# Patient Record
Sex: Female | Born: 1970 | Race: Black or African American | Hispanic: No | Marital: Married | Smoking: Never smoker
Health system: Southern US, Community
[De-identification: ages and names within clinical notes are randomized; demographics above are authoritative.]

## PROBLEM LIST (undated history)

## (undated) DIAGNOSIS — I10 Essential (primary) hypertension: Secondary | ICD-10-CM

## (undated) HISTORY — DX: Essential (primary) hypertension: I10

---

## 2000-01-19 ENCOUNTER — Encounter: Payer: Self-pay | Admitting: Obstetrics and Gynecology

## 2000-01-19 ENCOUNTER — Ambulatory Visit (HOSPITAL_COMMUNITY): Admission: RE | Admit: 2000-01-19 | Discharge: 2000-01-19 | Payer: Self-pay | Admitting: Obstetrics and Gynecology

## 2000-05-04 ENCOUNTER — Inpatient Hospital Stay (HOSPITAL_COMMUNITY): Admission: RE | Admit: 2000-05-04 | Discharge: 2000-05-06 | Payer: Self-pay | Admitting: Obstetrics and Gynecology

## 2000-11-24 ENCOUNTER — Inpatient Hospital Stay (HOSPITAL_COMMUNITY): Admission: AD | Admit: 2000-11-24 | Discharge: 2000-11-24 | Payer: Self-pay | Admitting: Obstetrics and Gynecology

## 2001-05-16 ENCOUNTER — Inpatient Hospital Stay (HOSPITAL_COMMUNITY): Admission: AD | Admit: 2001-05-16 | Discharge: 2001-05-20 | Payer: Self-pay | Admitting: Obstetrics and Gynecology

## 2001-05-16 ENCOUNTER — Encounter: Payer: Self-pay | Admitting: Obstetrics & Gynecology

## 2001-05-21 ENCOUNTER — Encounter: Admission: RE | Admit: 2001-05-21 | Discharge: 2001-06-20 | Payer: Self-pay | Admitting: Obstetrics and Gynecology

## 2001-05-23 ENCOUNTER — Inpatient Hospital Stay (HOSPITAL_COMMUNITY): Admission: AD | Admit: 2001-05-23 | Discharge: 2001-05-23 | Payer: Self-pay | Admitting: Obstetrics and Gynecology

## 2001-06-21 ENCOUNTER — Encounter: Admission: RE | Admit: 2001-06-21 | Discharge: 2001-07-21 | Payer: Self-pay | Admitting: Obstetrics and Gynecology

## 2001-07-22 ENCOUNTER — Encounter: Admission: RE | Admit: 2001-07-22 | Discharge: 2001-08-21 | Payer: Self-pay | Admitting: Obstetrics and Gynecology

## 2001-09-21 ENCOUNTER — Encounter: Admission: RE | Admit: 2001-09-21 | Discharge: 2001-10-21 | Payer: Self-pay | Admitting: Obstetrics and Gynecology

## 2005-12-07 ENCOUNTER — Encounter: Admission: RE | Admit: 2005-12-07 | Discharge: 2005-12-07 | Payer: Self-pay | Admitting: Obstetrics and Gynecology

## 2006-06-02 ENCOUNTER — Encounter: Admission: RE | Admit: 2006-06-02 | Discharge: 2006-06-02 | Payer: Self-pay | Admitting: Obstetrics and Gynecology

## 2006-06-07 ENCOUNTER — Encounter: Admission: RE | Admit: 2006-06-07 | Discharge: 2006-06-07 | Payer: Self-pay | Admitting: Interventional Radiology

## 2006-07-29 ENCOUNTER — Ambulatory Visit (HOSPITAL_COMMUNITY): Admission: RE | Admit: 2006-07-29 | Discharge: 2006-07-29 | Payer: Self-pay | Admitting: Interventional Radiology

## 2006-08-01 ENCOUNTER — Ambulatory Visit (HOSPITAL_COMMUNITY): Admission: RE | Admit: 2006-08-01 | Discharge: 2006-08-02 | Payer: Self-pay | Admitting: Interventional Radiology

## 2007-03-11 ENCOUNTER — Encounter: Admission: RE | Admit: 2007-03-11 | Discharge: 2007-03-11 | Payer: Self-pay | Admitting: Interventional Radiology

## 2007-03-15 ENCOUNTER — Encounter: Admission: RE | Admit: 2007-03-15 | Discharge: 2007-03-15 | Payer: Self-pay | Admitting: Interventional Radiology

## 2007-07-19 ENCOUNTER — Encounter: Payer: Self-pay | Admitting: Internal Medicine

## 2007-07-19 ENCOUNTER — Observation Stay (HOSPITAL_COMMUNITY): Admission: EM | Admit: 2007-07-19 | Discharge: 2007-07-21 | Payer: Self-pay | Admitting: Internal Medicine

## 2009-11-06 ENCOUNTER — Ambulatory Visit (HOSPITAL_BASED_OUTPATIENT_CLINIC_OR_DEPARTMENT_OTHER): Admission: RE | Admit: 2009-11-06 | Discharge: 2009-11-06 | Payer: Self-pay | Admitting: Obstetrics and Gynecology

## 2009-11-06 ENCOUNTER — Ambulatory Visit: Payer: Self-pay | Admitting: Diagnostic Radiology

## 2010-11-15 ENCOUNTER — Encounter: Payer: Self-pay | Admitting: Interventional Radiology

## 2010-11-17 ENCOUNTER — Other Ambulatory Visit (HOSPITAL_BASED_OUTPATIENT_CLINIC_OR_DEPARTMENT_OTHER): Payer: Self-pay | Admitting: Obstetrics and Gynecology

## 2010-11-17 DIAGNOSIS — Z139 Encounter for screening, unspecified: Secondary | ICD-10-CM

## 2010-11-25 ENCOUNTER — Encounter: Payer: Self-pay | Admitting: Obstetrics and Gynecology

## 2010-11-26 ENCOUNTER — Ambulatory Visit (HOSPITAL_BASED_OUTPATIENT_CLINIC_OR_DEPARTMENT_OTHER): Payer: Self-pay

## 2010-12-04 ENCOUNTER — Ambulatory Visit (HOSPITAL_BASED_OUTPATIENT_CLINIC_OR_DEPARTMENT_OTHER)
Admission: RE | Admit: 2010-12-04 | Discharge: 2010-12-04 | Disposition: A | Discharge status: Home / Self Care | Payer: BC Managed Care – PPO | Source: Ambulatory Visit | Attending: Obstetrics and Gynecology | Admitting: Obstetrics and Gynecology

## 2010-12-04 DIAGNOSIS — Z139 Encounter for screening, unspecified: Secondary | ICD-10-CM

## 2010-12-04 DIAGNOSIS — Z1231 Encounter for screening mammogram for malignant neoplasm of breast: Secondary | ICD-10-CM | POA: Insufficient documentation

## 2011-03-09 NOTE — H&P (Signed)
NAME:  Adrienne Long, Adrienne Long NO.:  [id number]   MEDICAL RECORD NO.:  [id number]          PATIENT TYPE:  EMS   LOCATION:  ED                           FACILITY:  [facility]   PHYSICIAN:  Herbie Saxon, MDDATE OF BIRTH:  02/23/1971   DATE OF ADMISSION:  07/19/2007  DATE OF DISCHARGE:                              HISTORY & PHYSICAL   PRIMARY CARE PHYSICIAN:  Dr. Lady Saucier of [facility].   PRESENTING COMPLAINT:  Lip and throat swelling, one day.   HISTORY OF PRESENTING COMPLAINT:  This is a 40 year old, African-  American female, who woke up at 2 a.m. earlier this morning with a sense  of severe lip and throat swelling associated with a mild difficulty with  breathing, no cough, no wheezes, no prior episode of body swelling.  There is no fever, no joint swelling, no skin rash.  She has a slight  itching on the right breast.  The patient was started on Lisinopril a  week ago by the primary care physician.  Presently she denies any chest  pain or palpitation.  There was no syncopal or seizure episode.  She is  not in respiratory distress presently.  No GI or genitourinary symptoms.  Twelve systems reviewed and pertinent as stated above.   PAST MEDICAL HISTORY:  Hypertension.   ALLERGIES:  No known drug allergies prior to this episode.   PAST SURGICAL HISTORY:  Myomectomy in 2001.   FAMILY HISTORY:  Father had heart disease.   SOCIAL HISTORY:  The patient is married and has one child. There is no  history of alcohol, tobacco, or illicit drug abuse.   ALLERGIES:  No known drug allergies  /MEDICATIONS/>  1. Lisinopril 10 mg daily.  2. HCTZ 12.5 mg daily.  3. Phentermine 37.5 mg daily.   PHYSICAL EXAMINATION:  GENERAL:  She is a young lady not in acute  respiratory distress.  VITAL SIGNS:  Temperature is 98, pulse is 98, respiratory rate is 16,  blood pressure 133/94.  HEENT:  Pupils equal and reactive to light and accommodation.  She is  nonverbal presently as she is unable to open her mouth.  The right upper  and lower lip are grossly swollen and not tender.  LUNGS:  Supple with no lymphadenopathy, no thyromegaly, no carotid  bruit.  CHEST:  The lungs are clinically clear, no rhonchi or rales.  CARDIAC:  Heart sounds 1 and 2, regular rate and rhythm.  ABDOMEN:  Soft and nontender, no organomegaly, bowel sounds normoactive,  inguinal orifices are intact .  EXTREMITIES:  peripheral pulses present, no pedal edema, power is 5  overly.   Labs are pending.   ASSESSMENT:  Severe angioedema most likely secondary to ACE-I and mild  hypertension.  pPLAN  The patient will be admitted to telemetry, and the airway will be  monitored and protected, O2 at two liters p.r.n. R  We will start her on  intravenous Solu-Medrol, intravenous Benadryl, and intravenous Pepcid.  Note, the patient has had mild improvement after being given intravenous  Solu-Medrol in the emergency room.  We will continue the  intravenous  Solu-Medrol 80 mg q.8h, intravenous Benadryl 25 mg q.8h, and intravenous  Pepcid 20 mg q.12 hourly.  Sequential compression boots for deep venous  thrombosis prophylaxis.  Will check an ESR, complement level.  The  patient has been advised to  avoid ACE-I and ARB antihypertensives in  the future.  The family care physician will be notified of this  development.  She is a Full Code.  Her treatment plan was  discussed  with her husband and her mother, and she can be discharged in 24 to 48  hours if continued clinical improvement.      Herbie Saxon, MD  Electronically Signed     MIO/MEDQ  D:  07/19/2007  T:  07/19/2007  Job:  [id number]   cc:   Lady Saucier, M.D.  [facility]

## 2011-03-12 NOTE — Discharge Summary (Signed)
[facility]  Patient:    Adrienne Long, Adrienne Long Visit Number: [id number] MRN: [id number]          Service Type: MED Location: [facility] Attending Physician:  Lenoard Aden Dictated by:   Sheria Lang Cherly Hensen, M.D. Adm. Date:  05/23/2001 Disc. Date: 05/23/2001                             Discharge Summary  ADMISSION DIAGNOSES:          1. Postdates.                               2. Previous myomectomy.  DISCHARGE DIAGNOSES:          1. Term gestation, delivered.                               2. Arrest of dilatation.                               3. Iron deficiency anemia.                               4. Fetal macrosomia.  PROCEDURE:                    Primary cesarean section, Kerr hysterotomy.  HISTORY OF PRESENT ILLNESS:   Please see the dictated history and physical. Essentially, this is a 40 year old, gravida 1, para 0, at 40-3/7 weeks admitted for induction of labor with a favorable cervix at 2 cm, 70% to 80% effaced, -3, vertex presentation. Estimated fetal weight by ultrasound had been 8 pounds 8 ounces on April 25, 2001. Group B strep culture was negative.  HOSPITAL COURSE:              The patient was admitted. She was started on Pitocin. The patient had artificial rupture of membranes, clear fluid. At that time, her cervix was a loose 4 cm, 70%, -3 station. Intrauterine pressure catheter was placed. Pitocin was continued. The patient progressed and arrested at 5 cm dilatation with a -3 vertex presentation despite adequate intrauterine pressure. For this reason, the patient was taken to the operating room where she underwent a prior cesarean section with a resultant delivery of a live female infant from the left occiput transverse position weighing 9 pounds 7 ounces, Apgars of 8 and 9. True knot was noted in the cord. The right tube and ovary had some adhesions to the right posterior uterus; otherwise normal. The left tube and ovary were  normal. During the procedure the patient had removal of a keloid scar. Estimated blood loss was 800 cc.  The patient had an unremarkable postoperative course. She was on a regular diet, passing flatus by postoperative day #2. She remained afebrile throughout her hospital course.  Her CBC on postoperative day #1 revealed a hemoglobin of 9.5, hematocrit of 28.6, platelet count of 243,000.  On postoperative day #3, with the patient having had a bowel movement, feeling well, incision without any evidence of erythema, induration, or exudate, was deemed to be discharged home.  DISPOSITION:                  Home.  CONDITION ON  DISCHARGE:       Stable.  DISCHARGE MEDICATIONS:        1. Tylox, #20, one to tablets every three to                                  four hours p.r.n. pain.                               2. Motrin 600 mg p.o. q.6h. p.r.n. pain.                               3. Ferrous sulfate 325 mg one p.o. b.i.d.  DISCHARGE FOLLOWUP:           The patient is to follow up in the office at [facility] at four to six weeks.  DISCHARGE INSTRUCTIONS:       The patient is to call for temperature greater than or equal to 100.4, nothing per vagina for four to six weeks, no heavy lifting or driving for two weeks, call if soaking a regular pad every hour or more frequently, call if increased incisional pain, incisional redness or drainage from the incision site, severe abdominal pain, nausea, or vomiting. Dictated by:   Sheria Lang. Cherly Hensen, M.D. Attending Physician:  Lenoard Aden DD:  06/18/01 TD:  06/19/01 Job: [id number]

## 2011-03-12 NOTE — H&P (Signed)
[facility]  Patient:    Adrienne Long, Adrienne Long                    MRN: [id number] Adm. Date:  05/16/01 Attending:  Nena Jordan A. Cherly Hensen, M.D.                         History and Physical  CHIEF COMPLAINT:              Post dates induction of labor.  HISTORY OF PRESENT ILLNESS:   This is a 40 year old gravida 1, para 18 female with LMP of August 05, 2000, [facility] of May 13, 2001 who is now at 81 3/[redacted] weeks gestation admitted for induction of labor.  Her prenatal course has been notable for excessive weight gain.  Last vaginal examination on May 12, 2001 the patient was 2 cm, 70-80% effaced, -3 vertex presentation.  Ultrasound on April 25, 2001 gave an estimated fetal weight of 8 pounds 8 ounces which was at the 96th percentile for 37 weeks and 4 days.  Normal amniotic fluid index. Patient has been evaluated for preeclampsia with normal [facility] laboratories on May 02, 2001.  Uric acid at that time was 5.4, platelet count 276,000, SGOT 20, creatinine 0.5, hematocrit 31.6.  Patient has had irregular contractions, intact membranes.  Group B strep culture is negative.  Prenatal care is at [facility], primary obstetrician Maxie Better, M.D.  PRENATAL LABORATORIES:        Blood type O+.  Hemoglobin electrophoresis negative.  RPR is nonreactive.  Rubella is immune.  Hepatitis B surface antigen is negative.  GC and chlamydia cultures are negative.  Pap is within normal limits.  One hour GTT was abnormal.  Three hour GTT was normal.  Group B strep culture is negative.  Ultrasound on December 08, 2000 was 17.6 weeks.  AFP 3 test was increased risk for Down.  Amniocentesis performed with normal karyotypes 46XY.  Normal anatomic fetal survey on December 26, 2000.  ALLERGIES:                    No known drug allergies.  MEDICATIONS:                  Prenatal vitamins.  PAST MEDICAL HISTORY:         Negative.  PAST SURGICAL HISTORY:        Myomectomy July 2001.   Cavity was not entered.  FAMILY HISTORY:               Diabetes in paternal grandmother, mother with thyroid dysfunction.  SOCIAL HISTORY:               Married.  Customer service representative. Nonsmoker.  REVIEW OF SYSTEMS:            Negative.  See history of present illness.  PHYSICAL EXAMINATION  GENERAL:                      Gravid black female in no acute distress.  VITAL SIGNS:                  Blood pressure 118/66, weight 244 pounds, fetal heart rate 148.  SKIN:                         No lesions.  HEENT:  Anicteric sclerae.  Pink conjunctivae. Oropharynx is negative.  HEART:                        Regular rate and rhythm without murmur.  LUNGS:                        Clear to auscultation.  BREASTS:                      Soft, nontender.  No palpable mass.  ABDOMEN:                      Gravid.  Fundal height of 40 cm.  PELVIC:                       See HPI.  EXTREMITIES:                  Trace edema.  IMPRESSION:                   Intrauterine gestation 14 and 3/7 weeks with favorable weeks.  PLAN:                         Admission.  Pitocin induction.  Routine laboratory studies.  Epidural p.r.n. DD:  05/16/01 TD:  05/16/01 Job: [id number]

## 2011-03-12 NOTE — Op Note (Signed)
[facility]  Patient:    TIERRE, GERARD                    MRN: [id number] Proc. Date: 05/17/01 Attending:  Nena Jordan A. Cherly Hensen, M.D.                           Operative Report  PREOPERATIVE DIAGNOSIS:       Arrestive dilatation.  POSTOPERATIVE DIAGNOSIS:      Arrestive dilatation and macrosomia.  OPERATION:                    Primary cesarean section, Kerr hysterotomy,                               revision of keloid scar.  SURGEON:                      Sheronette A. Cherly Hensen, M.D.  ANESTHESIA:                   Epidural.  INDICATIONS:                  This is a 40 year old gravida 1, para 0 female at 40-3/7 weeks admitted for induction of labor with a favorable cervix.  The patients history is notable for a previous myomectomy,.  The patient was induced with Pitocin.  She had artificial rupture of membranes and progressed to 5 cm when she arrested for several hours despite adequate _______ unit. For this reason, the patient was transferred to the operating room for a primary cesarean section.  Risks and benefits of the procedure have been explained to the patient and the family.  DESCRIPTION OF PROCEDURE:     Under adequate epidural anesthesia, the patient was placed in the supine position with a left lateral tilt.  An indwelling Foley catheter had already been in place.  The patient was therefore sterilely prepped and draped in the usual fashion.  About 7 cc of 0.25% Marcaine was injected along the previous Pfannenstiel incision.  A Pfannenstiel skin incision was made just above the previous keloid scar and carried down to the rectus fascia.  The rectus fascia was incised in the midline and extended bilaterally using cautery.  The rectus fascia was then bluntly and with cautery and scalpel dissected off the rectus muscle in a superior and inferior fashion.  There was a lot scarring encountered at this point.  The rectus muscle was  eventually split in the midline.  The parietal peritoneum was then opened and extended superiorly and inferiorly.  The vesicouterine peritoneum was then opened and extended bilaterally.  The bladder was then bluntly dissected off the lower uterine segment and displaced from the operative field using a bladder retractor.  A low transverse uterine incision was then made and extended bilaterally using bandaged scissors.  Initial inspection revealed a left occiput transverse presentation.  Vacuum was applied with pop-off x 4, with subsequent delivery of a live female infant with Apgars of 8 and 9.  Weight of 9 pounds and 7 ounces with a true knot in the cord.  The placenta was manually removed and was located anteriorly and posteriorly.  The uterine cavity was cleaned of debris.  The uterine incision was closed in two layers, the first layer with a 0 Monocryl running locked stitch, the second layer was imbricating  using 0 Monocryl.  The tubes and ovaries were inspected.  The left tube and ovary was free and normal.  The right tube and ovary was normal, but partially adherent to the right posterior uterine wall laterally.  The uterine incision was inspected and small bleeders cauterized.  The abdomen was irrigated and suctioned of debris.  The parietal peritoneum was inspected and small bleeders cauterized.  The rectus muscle was inspected and small bleeders cauterized.  The under surface of the rectus fascia was inspected and the button hole area was sutured with figure-of-eight suture.  With good hemostasis subsequently noted, the rectus fascia was closed with 0 Vicryl x 2.  The keloid scar was then removed and bleeders cauterized.  The subcutaneous area was irrigated.  Small bleeders again cauterized.  The skin was approximated using Ethicon staples.  Specimens were the placenta and the keloid scar sent to pathology.  Estimated blood loss was 800 cc. Intraoperative fluids of 2 L.  Urine output  was 150 cc of clear yellow urine. Sponge and instrument counts x 2 was correct.  Complications none.  The patient tolerated the procedure well and was transferred to the recovery room in stable condition. DD:  05/17/01 TD:  05/17/01 Job: [id number]

## 2011-03-12 NOTE — Op Note (Signed)
[facility]  Patient:    Adrienne Long, Adrienne Long                  MRN: [id number] Proc. Date: 05/04/00 Adm. Date:  [id number] Attending:  Maxie Better                           Operative Report  PREOPERATIVE DIAGNOSIS:       Menorrhagia, uterine fibroids.  POSTOPERATIVE DIAGNOSIS:      Intramural and subserosal fibroids, menorrhagia.  OPERATION:                    Exploratory laparotomy, multiple myomectomy.  SURGEON:                      Sheronette A. Cherly Hensen, M.D.  ASSISTANT:                    Cordelia Pen A. Rosalio Macadamia, M.D.  ANESTHESIA:                   General anesthesia.  ESTIMATED BLOOD LOSS:  INDICATIONS:                  This is a 40 year old, gravida 0, female with symptomatic uterine fibroids who is now being admitted for removal of her fibroids which have been documented by ultrasound and confirmed on the pelvic MRI.  The patient desires to preserve her fertility.  The risks and benefits of her procedure have been explained.  Consent was signed.  The patient was transferred to the operating room.  DESCRIPTION OF PROCEDURE:     Under adequate general anesthesia, the patient was placed in the supine position.  The abdomen and vagina were sterilely prepped, bivalve speculum was placed in the vagina.  A #8 French catheter was introduced  into the uterine cavity to which is attached methylene blue dye.  The bivalve speculum was removed.  Indwelling Foley catheter was thoroughly placed. Pneumoboots were in place.  Antibiotic prophylaxis was given.  The patient was hen sterilely draped.  A marking pen was used to outline the low transverse incision. 10 cc of 0.25% Marcaine was then injected.  Incision was then cut with a scalpel and then carried down to the rectus fascia using Bovie cautery.  The rectus fascia is incised in the midline and extended bilaterally.  The rectus fascia is then bluntly and with cautery dissected off the  rectus muscle in superior and inferior fashion.  The patient was noted to have a large and prominent pyramidalis muscles. These were then split in the midline.  The parietoperitoneum was sharply entered and extended superiorly and inferiorly.  Exploration of the upper abdomen was notable for a normal liver edge and normal palpable kidneys.  There was a retrocecal appendix.  The pelvis was notable for a large uterus about 16 weeks ize with a very large posterior intramural/subserosal fibroid.  Both tubes were noted to be normal.  Left ovary was normal.  Right ovary was normal with evidence of  corpus luteal cyst that had ruptured, but was not bleeding.  Anterior to the uterus was a 3 cm pedunculated fibroid and some other smaller superficial fibroids and two other intramural fibroids were noted.  Using a short Simpson after multiple attempts at exteriorizing the uterus, the uterus was helped to be lifted with the forceps and exteriorized thereafter.  A corkscrew was placed fundally which also  assisted with the traction of the uterus.  Using a dilute solution of Pitressin, the serosa overlying the fibroids was injected and a posterior vertical incision was then made.  The large fibroid was enucleated from its base.  Using the apparatus that was in the uterus, methylene blue was injected and no evidence of dye seen.  Through that same incision, a left intramural fibroid was also removed. That particular site was closed with interrupted 0 Vicryl figure-of-eight sutures. The large posterior incision was then closed in three layers, the first layer with a running stitch of 0 Vicryl followed by interrupted U mattress sutures of 0 Vicryl and the subserosal surface closed with a baseball stitch of 2-0 Monocryl. The pedunculated anterior fibroid was also removed after injecting the base with Pitressin and using cautery to remove it.  The defect was closed with interrupted 0 Vicryl  suture.  Smaller superficial fibroids were also removed, they were less han 4 mm in size.  The broad ligament and the uterus was palpated and no other fibroids were palpable.  At that point, the uterus was returned to the abdomen.  The abdomen was irrigated and reinspection of the incision sites showed good hemostasis. Interceed was placed on the posterior incision.  Reinspection of the ovary on the right showed no bleeding.  Packings that had been in the upper abdomen were removed.  The fascia undersurface was inspected.  Small bleeders cauterized. Good hemostasis was then noted.  The fascia was closed with 0 Vicryl x 2.  The subcutaneous area was irrigated and small bleeders cauterized.  The skin was approximated using Ethicon staples.  The specimen was myoma x 3.  Estimated blood loss was 50 cc.  Intraoperative fluid was 1700 cc Crystalloid.  Urine output was 300 cc of clear yellow urine.  The instruments from the vagina was also removed. The tube from the vagina was removed.  Complications were none.  The patient tolerated the procedure well and was transferred to the recovery room in stable  condition. DD:  05/04/00 TD:  05/04/00 Job: [id number]

## 2011-03-12 NOTE — H&P (Signed)
[facility]  Patient:    Adrienne Long, Adrienne Long                    MRN: [id number] Adm. Date:  05/04/00 Attending:  Nena Jordan A. Cherly Hensen, M.D.                         History and Physical  CHIEF COMPLAINT:              Heavy menses, uterine fibroids.  HISTORY OF PRESENT ILLNESS:   This is a 40 year old G0 married black female, Last menstrual period April 05, 2000, with known uterine fibroids who is now being admitted for a myomectomy.  The patient had discontinued birth control pills for greater than a year and noted her cycles were every 28 days, lasting about four days with the first three days being heavy with the need to change four pads per hour by the second and third day some small sized clots and some menstrual cramping.  Evaluation of the patient revealed ultrasound on January 12, 2000 that showed a 10.2 x 8.6 x 9.2 anterior fibroid and the endometrial thickness was unable to be certain due to the fibroid.  Both ovaries were normal.  The patient underwent a pelvic MRI on January 19, 2000.  This showed an enlarged uterus measuring 13 x 9 x 10 cm, at least eight discreet fibroids involving the uterus were noted.  The largest is felt to be intramural and located on the right uterine body and measured 10 cm in its maximum diameter. The endometrial cavity was displaced to the left.  There was a subserosal left lateral lower uterine segment fibroid measuring 2 x 3 cm and both ovaries were normal.  There were no pelvic adenopathy seen.  The patient does desire to conceive in the future.  ALLERGIES:                    No known drug allergies.  MEDICATIONS:                  Vitamins.  PAST MEDICAL HISTORY:         Negative.  PAST SURGICAL HISTORY:        Negative.  FAMILY HISTORY:               Notable for grandmother with diabetes and hypertension.  No breast, genital, or colon cancer history.  SOCIAL HISTORY:               Married.  Nonsmoker.   Worked as a Occupational psychologist at [employer].  REVIEW OF SYSTEMS:            No urinary symptoms.  Pelvic pressure and the rest of the other symptoms were notable for sinus problems and no other symptoms.  PHYSICAL EXAMINATION:  GENERAL:                      She is a well-developed, well-nourished, slightly obese black female in no acute distress.  VITAL SIGNS:                  Blood pressure 120/76.  Weight 196 pounds. Height 5 feet 7.5 inches.  SKIN:                         No lesions.  HEENT:  Anicteric sclerae.  Pale pink conjunctivae. Oropharynx:  Negative.  NECK:                         Supple.  No palpable thyroid.  NODES:                        No supraclavicular or axillary nodes palpable.  LUNGS:                        Clear to auscultation.  HEART:                        Regular rate and rhythm without murmur.  BREASTS:                      Soft, nontender.  No palpable mass.  ABDOMEN:                      Palpable suprapubic mass.  Nontender.  BACK:                         No CVA tenderness.  PELVIC:                       Vulva:  No lesions.  Vagina:  Slight white discharge for which a wet prep was performed.  Cervix:  No lesions.  Uterus was about 14-16 weeks size, irregular.  Adnexa:  Not appreciable secondary to the enlarged uterus.  RECTAL:                       Not indicated.  IMPRESSION:                   Symptomatic uterine fibroid.  PLAN:                         Admission.  Myomectomy.  Antibiotics prophylaxis.  ______.  Risks and benefits of procedure have been explained at length to the patient included, but not limited to, infection, bleeding which may require blood transfusion, hysterectomy in rare circumstances for uncontrollable bleeding, possible need for cesarean section in the future, internal scar tissue which may cause secondary infertility, bowel obstruction due to adhesions, possible need for  the same surgery or hysterectomy in the future up to 30% which may be decreased by the number of fibroids removed at the initial surgery.  Postop care was reviewed and the criterias for discharge were also discussed.  All questions answered. DD:  05/03/00 TD:  05/03/00 Job: [id number]

## 2011-03-12 NOTE — Discharge Summary (Signed)
[facility] [place]  Patient:    Adrienne Long, Adrienne Long                  MRN: [id number] Adm. Date:  [id number] Disc. Date: [id number] Attending:  Maxie Better                           Discharge Summary  ADMISSION DIAGNOSES:          1. Menorrhagia.                               2. Uterine fibroids.  DISCHARGE DIAGNOSES:          1. Intramural/subserosal fibroids.                               2. Menorrhagia.                               3. Iron deficiency anemia.  PROCEDURE:                    Exploratory laparotomy, myomectomy.  HISTORY OF PRESENT ILLNESS:   This is a 40 year old gravida 0 married white female with symptomatic uterine fibroids who is now being admitted for myomectomy.  Please see the dictated history and physical for the specific details.  HOSPITAL COURSE:              The patient was admitted to [facility] on May 04, 2000.  A consent was obtained for procedure.  Her preoperative laboratory studies were reviewed and was noted to have a hematocrit of 29.2, hemoglobin of 9.0, white count 5.8.  The patient was taken to the operating room.  She underwent exploratory laparotomy and myomectomy.  The findings at the time of the surgery were that of a 16 week sized uterus with a large posterior intramural fibroid measuring about 10 cm and anterior left subserosal fibroid approximately 3 cm was also noted.  Normal tubes were noted bilaterally.  The ovaries were noted bilaterally.  The right ovary with a corpus luteal cyst.  There is a retrocecal appendix.  Normal liver edge. Normal palpable kidney.  Her endometrial cavity was not entered.  Three myomas were removed.  Her estimated blood loss was 50 cc.  The pathology confirmed leiomyomata.  Her postoperative course was uncomplicated.  The patient had a ferritin level drawn which was 14.  Hemoglobin electrophoresis was subsequently noted to be normal.  Her postop CBC on day #1 had a  hemoglobin of 8.3, hematocrit 26.0.  The patient was started on iron supplementation with the passage of flatus.  She was tolerating a regular diet.  She remained afebrile throughout her course.  On postop day #2 with the patient tolerating a regular diet and showing no evidence of infection, she was deemed well to be discharged home.  Her incision which was transverse had staples.  There was no erythema, induration, or exudate.  Discharge instructions were call for temperature greater or equal to 100.4.  Nothing per vagina for four to six weeks.  No heavy lifting.  No driving for two weeks.  Call if severe abdominal pain, nausea, vomiting, or unable to tolerate food.  Staple removal planned in the office the following Wednesday.  DISPOSITION:  Home.  CONDITION:                    Stable.  DISCHARGE MEDICATIONS:        1. Niferex 150 mg one p.o. b.i.d.                               2. Tylox #30 one to two tablets every three to                                  four hours p.r.n. pain.                               3. Colace 100 mg p.o. b.i.d.  FOLLOW-UP:                    In four to six weeks for postoperative check. Staple removal in the office next Wednesday. DD:  06/07/00 TD:  06/07/00 Job: [id number]

## 2011-08-05 LAB — CBC
HCT: 39.6
Hemoglobin: 12.8
MCV: 84.5
Platelets: 271
RBC: 4.68
RDW: 15.1 — ABNORMAL HIGH
WBC: 7.1

## 2011-08-05 LAB — DIFFERENTIAL
Basophils Absolute: 0
Eosinophils Relative: 0
Lymphocytes Relative: 7 — ABNORMAL LOW
Lymphs Abs: 0.5 — ABNORMAL LOW
Monocytes Absolute: 0 — ABNORMAL LOW
Neutro Abs: 6.6

## 2011-08-05 LAB — COMPREHENSIVE METABOLIC PANEL
ALT: 15
AST: 16
Alkaline Phosphatase: 62
CO2: 26
Chloride: 104
GFR calc non Af Amer: >60
Sodium: 138
Total Bilirubin: 0.5

## 2011-08-05 LAB — URINALYSIS, ROUTINE W REFLEX MICROSCOPIC

## 2011-08-05 LAB — PROTIME-INR
INR: 1
Prothrombin Time: 13.1

## 2011-08-05 LAB — APTT: aPTT: 29

## 2011-12-13 ENCOUNTER — Other Ambulatory Visit (HOSPITAL_BASED_OUTPATIENT_CLINIC_OR_DEPARTMENT_OTHER): Payer: Self-pay | Admitting: Obstetrics and Gynecology

## 2011-12-13 DIAGNOSIS — Z1231 Encounter for screening mammogram for malignant neoplasm of breast: Secondary | ICD-10-CM

## 2011-12-15 ENCOUNTER — Ambulatory Visit (HOSPITAL_BASED_OUTPATIENT_CLINIC_OR_DEPARTMENT_OTHER)
Admission: RE | Admit: 2011-12-15 | Discharge: 2011-12-15 | Disposition: A | Discharge status: Home / Self Care | Payer: BC Managed Care – PPO | Source: Ambulatory Visit | Attending: Obstetrics and Gynecology | Admitting: Obstetrics and Gynecology

## 2011-12-15 DIAGNOSIS — Z1231 Encounter for screening mammogram for malignant neoplasm of breast: Secondary | ICD-10-CM

## 2011-12-21 ENCOUNTER — Other Ambulatory Visit: Payer: Self-pay | Admitting: Obstetrics and Gynecology

## 2011-12-21 DIAGNOSIS — R928 Other abnormal and inconclusive findings on diagnostic imaging of breast: Secondary | ICD-10-CM

## 2011-12-22 ENCOUNTER — Ambulatory Visit
Admission: RE | Admit: 2011-12-22 | Discharge: 2011-12-22 | Disposition: A | Discharge status: Home / Self Care | Payer: BC Managed Care – PPO | Source: Ambulatory Visit | Attending: Obstetrics and Gynecology | Admitting: Obstetrics and Gynecology

## 2011-12-22 DIAGNOSIS — R928 Other abnormal and inconclusive findings on diagnostic imaging of breast: Secondary | ICD-10-CM

## 2012-05-22 ENCOUNTER — Other Ambulatory Visit: Payer: Self-pay | Admitting: Obstetrics and Gynecology

## 2012-05-22 DIAGNOSIS — N649 Disorder of breast, unspecified: Secondary | ICD-10-CM

## 2012-06-02 ENCOUNTER — Ambulatory Visit
Admission: RE | Admit: 2012-06-02 | Discharge: 2012-06-02 | Disposition: A | Discharge status: Home / Self Care | Payer: BC Managed Care – PPO | Source: Ambulatory Visit | Attending: Obstetrics and Gynecology | Admitting: Obstetrics and Gynecology

## 2012-06-02 ENCOUNTER — Other Ambulatory Visit: Payer: BC Managed Care – PPO

## 2012-06-02 DIAGNOSIS — N649 Disorder of breast, unspecified: Secondary | ICD-10-CM

## 2012-11-17 ENCOUNTER — Other Ambulatory Visit (HOSPITAL_BASED_OUTPATIENT_CLINIC_OR_DEPARTMENT_OTHER): Payer: Self-pay | Admitting: Obstetrics and Gynecology

## 2012-11-17 DIAGNOSIS — Z1231 Encounter for screening mammogram for malignant neoplasm of breast: Secondary | ICD-10-CM

## 2012-12-18 ENCOUNTER — Ambulatory Visit (HOSPITAL_BASED_OUTPATIENT_CLINIC_OR_DEPARTMENT_OTHER)
Admission: RE | Admit: 2012-12-18 | Discharge: 2012-12-18 | Disposition: A | Discharge status: Home / Self Care | Payer: BC Managed Care – PPO | Source: Ambulatory Visit | Attending: Obstetrics and Gynecology | Admitting: Obstetrics and Gynecology

## 2012-12-18 DIAGNOSIS — Z1231 Encounter for screening mammogram for malignant neoplasm of breast: Secondary | ICD-10-CM | POA: Insufficient documentation

## 2012-12-20 ENCOUNTER — Other Ambulatory Visit: Payer: Self-pay | Admitting: Obstetrics and Gynecology

## 2012-12-20 DIAGNOSIS — R928 Other abnormal and inconclusive findings on diagnostic imaging of breast: Secondary | ICD-10-CM

## 2012-12-29 ENCOUNTER — Other Ambulatory Visit: Payer: BC Managed Care – PPO

## 2013-01-02 ENCOUNTER — Ambulatory Visit
Admission: RE | Admit: 2013-01-02 | Discharge: 2013-01-02 | Disposition: A | Discharge status: Home / Self Care | Payer: BC Managed Care – PPO | Source: Ambulatory Visit | Attending: Obstetrics and Gynecology | Admitting: Obstetrics and Gynecology

## 2013-01-02 DIAGNOSIS — R928 Other abnormal and inconclusive findings on diagnostic imaging of breast: Secondary | ICD-10-CM

## 2013-11-02 ENCOUNTER — Other Ambulatory Visit (HOSPITAL_BASED_OUTPATIENT_CLINIC_OR_DEPARTMENT_OTHER): Payer: Self-pay | Admitting: Obstetrics and Gynecology

## 2013-11-02 DIAGNOSIS — Z1231 Encounter for screening mammogram for malignant neoplasm of breast: Secondary | ICD-10-CM

## 2013-12-19 ENCOUNTER — Ambulatory Visit (HOSPITAL_BASED_OUTPATIENT_CLINIC_OR_DEPARTMENT_OTHER)
Admission: RE | Admit: 2013-12-19 | Discharge: 2013-12-19 | Disposition: A | Discharge status: Home / Self Care | Payer: BC Managed Care – PPO | Source: Ambulatory Visit | Attending: Obstetrics and Gynecology | Admitting: Obstetrics and Gynecology

## 2013-12-19 DIAGNOSIS — Z1231 Encounter for screening mammogram for malignant neoplasm of breast: Secondary | ICD-10-CM | POA: Insufficient documentation

## 2014-11-14 ENCOUNTER — Other Ambulatory Visit (HOSPITAL_BASED_OUTPATIENT_CLINIC_OR_DEPARTMENT_OTHER): Payer: Self-pay | Admitting: Obstetrics and Gynecology

## 2014-11-14 DIAGNOSIS — Z1231 Encounter for screening mammogram for malignant neoplasm of breast: Secondary | ICD-10-CM

## 2014-12-24 ENCOUNTER — Ambulatory Visit (HOSPITAL_BASED_OUTPATIENT_CLINIC_OR_DEPARTMENT_OTHER)
Admission: RE | Admit: 2014-12-24 | Discharge: 2014-12-24 | Disposition: A | Discharge status: Home / Self Care | Payer: BLUE CROSS/BLUE SHIELD | Source: Ambulatory Visit | Attending: Obstetrics and Gynecology | Admitting: Obstetrics and Gynecology

## 2014-12-24 DIAGNOSIS — Z1231 Encounter for screening mammogram for malignant neoplasm of breast: Secondary | ICD-10-CM | POA: Diagnosis not present

## 2015-10-15 DIAGNOSIS — E785 Hyperlipidemia, unspecified: Secondary | ICD-10-CM | POA: Insufficient documentation

## 2015-12-12 ENCOUNTER — Other Ambulatory Visit (HOSPITAL_BASED_OUTPATIENT_CLINIC_OR_DEPARTMENT_OTHER): Payer: Self-pay | Admitting: Obstetrics and Gynecology

## 2015-12-12 DIAGNOSIS — Z1231 Encounter for screening mammogram for malignant neoplasm of breast: Secondary | ICD-10-CM

## 2015-12-25 ENCOUNTER — Ambulatory Visit (HOSPITAL_BASED_OUTPATIENT_CLINIC_OR_DEPARTMENT_OTHER): Payer: BLUE CROSS/BLUE SHIELD

## 2015-12-30 ENCOUNTER — Ambulatory Visit (HOSPITAL_BASED_OUTPATIENT_CLINIC_OR_DEPARTMENT_OTHER)
Admission: RE | Admit: 2015-12-30 | Discharge: 2015-12-30 | Disposition: A | Discharge status: Home / Self Care | Payer: BLUE CROSS/BLUE SHIELD | Source: Ambulatory Visit | Attending: Obstetrics and Gynecology | Admitting: Obstetrics and Gynecology

## 2015-12-30 DIAGNOSIS — Z1231 Encounter for screening mammogram for malignant neoplasm of breast: Secondary | ICD-10-CM | POA: Insufficient documentation

## 2016-11-29 ENCOUNTER — Other Ambulatory Visit (HOSPITAL_BASED_OUTPATIENT_CLINIC_OR_DEPARTMENT_OTHER): Payer: Self-pay | Admitting: Obstetrics and Gynecology

## 2016-11-29 DIAGNOSIS — Z1231 Encounter for screening mammogram for malignant neoplasm of breast: Secondary | ICD-10-CM

## 2017-01-03 ENCOUNTER — Ambulatory Visit (HOSPITAL_BASED_OUTPATIENT_CLINIC_OR_DEPARTMENT_OTHER)
Admission: RE | Admit: 2017-01-03 | Discharge: 2017-01-03 | Disposition: A | Discharge status: Home / Self Care | Payer: BLUE CROSS/BLUE SHIELD | Source: Ambulatory Visit | Attending: Obstetrics and Gynecology | Admitting: Obstetrics and Gynecology

## 2017-01-03 ENCOUNTER — Encounter (HOSPITAL_BASED_OUTPATIENT_CLINIC_OR_DEPARTMENT_OTHER): Payer: Self-pay

## 2017-01-03 DIAGNOSIS — Z1231 Encounter for screening mammogram for malignant neoplasm of breast: Secondary | ICD-10-CM | POA: Insufficient documentation

## 2017-01-04 ENCOUNTER — Ambulatory Visit (HOSPITAL_BASED_OUTPATIENT_CLINIC_OR_DEPARTMENT_OTHER): Payer: BLUE CROSS/BLUE SHIELD

## 2017-12-20 ENCOUNTER — Other Ambulatory Visit (HOSPITAL_BASED_OUTPATIENT_CLINIC_OR_DEPARTMENT_OTHER): Payer: Self-pay | Admitting: Obstetrics and Gynecology

## 2017-12-20 DIAGNOSIS — Z1231 Encounter for screening mammogram for malignant neoplasm of breast: Secondary | ICD-10-CM

## 2018-01-04 ENCOUNTER — Ambulatory Visit (HOSPITAL_BASED_OUTPATIENT_CLINIC_OR_DEPARTMENT_OTHER): Payer: BLUE CROSS/BLUE SHIELD

## 2018-01-05 ENCOUNTER — Ambulatory Visit (HOSPITAL_BASED_OUTPATIENT_CLINIC_OR_DEPARTMENT_OTHER): Payer: BLUE CROSS/BLUE SHIELD

## 2018-01-13 ENCOUNTER — Ambulatory Visit (HOSPITAL_BASED_OUTPATIENT_CLINIC_OR_DEPARTMENT_OTHER)
Admission: RE | Admit: 2018-01-13 | Discharge: 2018-01-13 | Disposition: A | Discharge status: Home / Self Care | Payer: BLUE CROSS/BLUE SHIELD | Source: Ambulatory Visit | Attending: Obstetrics and Gynecology | Admitting: Obstetrics and Gynecology

## 2018-01-13 DIAGNOSIS — Z1231 Encounter for screening mammogram for malignant neoplasm of breast: Secondary | ICD-10-CM

## 2018-01-16 ENCOUNTER — Other Ambulatory Visit: Payer: Self-pay | Admitting: Obstetrics and Gynecology

## 2018-01-16 DIAGNOSIS — R928 Other abnormal and inconclusive findings on diagnostic imaging of breast: Secondary | ICD-10-CM

## 2018-01-20 ENCOUNTER — Ambulatory Visit
Admission: RE | Admit: 2018-01-20 | Discharge: 2018-01-20 | Disposition: A | Discharge status: Home / Self Care | Payer: BLUE CROSS/BLUE SHIELD | Source: Ambulatory Visit | Attending: Obstetrics and Gynecology | Admitting: Obstetrics and Gynecology

## 2018-01-20 ENCOUNTER — Other Ambulatory Visit: Payer: Self-pay | Admitting: Obstetrics and Gynecology

## 2018-01-20 DIAGNOSIS — R928 Other abnormal and inconclusive findings on diagnostic imaging of breast: Secondary | ICD-10-CM

## 2018-01-20 DIAGNOSIS — N6489 Other specified disorders of breast: Secondary | ICD-10-CM

## 2018-07-24 ENCOUNTER — Other Ambulatory Visit: Payer: Self-pay | Admitting: Obstetrics and Gynecology

## 2018-07-24 ENCOUNTER — Ambulatory Visit
Admission: RE | Admit: 2018-07-24 | Discharge: 2018-07-24 | Disposition: A | Discharge status: Home / Self Care | Payer: BLUE CROSS/BLUE SHIELD | Source: Ambulatory Visit | Attending: Obstetrics and Gynecology | Admitting: Obstetrics and Gynecology

## 2018-07-24 DIAGNOSIS — N6489 Other specified disorders of breast: Secondary | ICD-10-CM

## 2019-01-18 ENCOUNTER — Other Ambulatory Visit: Payer: Self-pay

## 2019-01-18 ENCOUNTER — Ambulatory Visit
Admission: RE | Admit: 2019-01-18 | Discharge: 2019-01-18 | Disposition: A | Discharge status: Home / Self Care | Payer: BLUE CROSS/BLUE SHIELD | Source: Ambulatory Visit | Attending: Obstetrics and Gynecology | Admitting: Obstetrics and Gynecology

## 2019-01-18 DIAGNOSIS — N6489 Other specified disorders of breast: Secondary | ICD-10-CM

## 2019-01-23 ENCOUNTER — Inpatient Hospital Stay: Admission: RE | Admit: 2019-01-23 | Payer: BLUE CROSS/BLUE SHIELD | Source: Ambulatory Visit

## 2019-12-24 ENCOUNTER — Other Ambulatory Visit (HOSPITAL_BASED_OUTPATIENT_CLINIC_OR_DEPARTMENT_OTHER): Payer: Self-pay | Admitting: Family Medicine

## 2019-12-24 DIAGNOSIS — Z1231 Encounter for screening mammogram for malignant neoplasm of breast: Secondary | ICD-10-CM

## 2020-01-23 ENCOUNTER — Other Ambulatory Visit: Payer: Self-pay

## 2020-01-23 ENCOUNTER — Ambulatory Visit (HOSPITAL_BASED_OUTPATIENT_CLINIC_OR_DEPARTMENT_OTHER)
Admission: RE | Admit: 2020-01-23 | Discharge: 2020-01-23 | Disposition: A | Discharge status: Home / Self Care | Payer: BC Managed Care – PPO | Source: Ambulatory Visit | Attending: Family Medicine | Admitting: Family Medicine

## 2020-01-23 DIAGNOSIS — Z1231 Encounter for screening mammogram for malignant neoplasm of breast: Secondary | ICD-10-CM | POA: Insufficient documentation

## 2020-08-29 ENCOUNTER — Encounter: Payer: Self-pay | Admitting: Cardiovascular Disease

## 2020-08-29 ENCOUNTER — Other Ambulatory Visit: Payer: Self-pay

## 2020-08-29 ENCOUNTER — Ambulatory Visit: Payer: BC Managed Care – PPO | Admitting: Cardiovascular Disease

## 2020-08-29 DIAGNOSIS — E669 Obesity, unspecified: Secondary | ICD-10-CM | POA: Diagnosis not present

## 2020-08-29 DIAGNOSIS — I10 Essential (primary) hypertension: Secondary | ICD-10-CM | POA: Diagnosis not present

## 2020-08-29 HISTORY — DX: Obesity, unspecified: E66.9

## 2020-08-29 HISTORY — DX: Essential (primary) hypertension: I10

## 2020-08-29 NOTE — Patient Instructions (Signed)
Medication Instructions:  ?Your physician recommends that you continue on your current medications as directed. Please refer to the Current Medication list given to you today.  ? ?Labwork: ?NONE ? ?Testing/Procedures: ?NONE ? ?Follow-Up: ?AS NEEDED  ? ?  ?

## 2020-08-29 NOTE — Progress Notes (Signed)
Cardiology Office Note   Date:  08/29/2020   ID:  Adrienne Long, DOB Apr 24, 1971, MRN [id number]  PCP:  Laurena Bering, DO  Cardiologist:   Chilton Si, MD   Chief Complaint  Patient presents with  . New Patient (Initial Visit)      History of Present Illness: Adrienne Long is a 49 y.o. female with hypertension who is being seen today for the evaluation of hypertension at the request of Adrienne Better, MD.   She was first diagnosed with hypertension in the mid 1990s.  She injured her back and her BP was very elevated.  It improved somewhat with treatement of her back.  She saw her PCP and was started on lisinopril and amlodipine.  She had an angioedema allergy to lisinopril.  She was able to be on a lower dose when she lost weight but the dose was increased when she gained the weight back.  Lately she has been working on diet.  She is limiting her carbohydrates and focusing on eating more proteins.  She make sure to try and eat lean proteins.  She mostly cooks at home and rarely eats out.  She limits her sodium intake.  She has occasional coffee but has limited her caffeine intake.  She does not drink alcohol and does not use NSAIDs.  She notes that she has been very stressed in the past.  Her father was diagnosed with lung cancer.  Fortunately, he has responded well to radiation and is doing much Long.  She thinks this may have contributed to her blood pressure being elevated before.  She is very consistent about taking her medications and overall feels well.  She walks for exercise most days per week and gets about 10,000 steps daily.  She has no exertional chest pain or shortness of breath.  Overall she is well and without complaint.   Past Medical History:  Diagnosis Date  . Essential hypertension 08/29/2020  . Hypertension   . Obesity (BMI 30-39.9) 08/29/2020    History reviewed. No pertinent surgical history.   Current Outpatient Medications  Medication  Sig Dispense Refill  . amLODipine (NORVASC) 10 MG tablet Take 10 mg by mouth daily.    . Ascorbic Acid (VITAMIN C) 100 MG tablet Take 100 mg by mouth daily.    . Calcium-Magnesium-Zinc 333-133-5 MG TABS Take 1 tablet by mouth daily.     . Cholecalciferol (D3-1000 PO) Take 1,000 mg by mouth daily.     . norethindrone (HEATHER) 0.35 MG tablet Take 1 tablet by mouth daily.     No current facility-administered medications for this visit.    Allergies:   Ace inhibitors    Social History:  The patient  reports that she has never smoked. She has never used smokeless tobacco.   Family History:  The patient's family history includes CAD in her father; COPD in her father; Cancer in her paternal grandfather and paternal grandmother; Gallbladder disease in her sister; Heart attack in her maternal grandmother; Heart failure in her father; Hypertension in her father; Lung cancer in her father; Thyroid disease in her mother.    ROS:  Please see the history of present illness.   Otherwise, review of systems are positive for none.   All other systems are reviewed and negative.    PHYSICAL EXAM: VS:  BP 110/74 (BP Location: Right Arm, Patient Position: Sitting, Cuff Size: Large)   Pulse 81   Ht 5' 7.5" (1.715 m)  Wt 227 lb (103 kg)   BMI 35.03 kg/m  , BMI Body mass index is 35.03 kg/m. GENERAL:  Well appearing HEENT:  Pupils equal round and reactive, fundi not visualized, oral mucosa unremarkable NECK:  No jugular venous distention, waveform within normal limits, carotid upstroke brisk and symmetric, no bruits LUNGS:  Clear to auscultation bilaterally HEART:  RRR.  PMI not displaced or sustained,S1 and S2 within normal limits, no S3, no S4, no clicks, no rubs, no murmurs ABD:  Flat, positive bowel sounds normal in frequency in pitch, no bruits, no rebound, no guarding, no midline pulsatile mass, no hepatomegaly, no splenomegaly EXT:  2 plus pulses throughout, no edema, no cyanosis no  clubbing SKIN:  No rashes no nodules NEURO:  Cranial nerves II through XII grossly intact, motor grossly intact throughout PSYCH:  Cognitively intact, oriented to person place and time   EKG:  EKG is ordered today. The ekg ordered today demonstrates sinus rhythm.  Rate 81 bpm.     Recent Labs: No results found for requested labs within last 8760 hours.    Lipid Panel No results found for: CHOL, TRIG, HDL, CHOLHDL, VLDL, LDLCALC, LDLDIRECT    Wt Readings from Last 3 Encounters:  08/29/20 227 lb (103 kg)      ASSESSMENT AND PLAN:  # Essential hypertension:  BP is now well-controlled on amlodipine.  Overall she is doing well.  She is limiting her sodium intake and does not have high intake of caffeine or alcohol.  Given that her blood pressure is well-controlled on one medication, we will defer any secondary work-up.  Continue getting at least 150 minutes of exercise weekly and limiting sodium to 1500 mg.  # Obesity:  Continue working on diet and exercise as above.  She sees her PCP next month and will have lipids checked at that time.   Current medicines are reviewed at length with the patient today.  The patient does not have concerns regarding medicines.  The following changes have been made:  no change  Labs/ tests ordered today include:  No orders of the defined types were placed in this encounter.    Disposition:   FU with Leiana Rund C. Duke Salvia, MD, [facility] as needed.     Signed, Arber Wiemers C. Duke Salvia, MD, [facility]  08/29/2020 4:14 PM    [facility]

## 2020-12-01 ENCOUNTER — Other Ambulatory Visit (HOSPITAL_BASED_OUTPATIENT_CLINIC_OR_DEPARTMENT_OTHER): Payer: Self-pay | Admitting: Family Medicine

## 2020-12-01 DIAGNOSIS — Z1231 Encounter for screening mammogram for malignant neoplasm of breast: Secondary | ICD-10-CM

## 2020-12-02 ENCOUNTER — Ambulatory Visit (HOSPITAL_BASED_OUTPATIENT_CLINIC_OR_DEPARTMENT_OTHER): Payer: BC Managed Care – PPO

## 2021-01-26 ENCOUNTER — Ambulatory Visit (HOSPITAL_BASED_OUTPATIENT_CLINIC_OR_DEPARTMENT_OTHER): Payer: BC Managed Care – PPO

## 2021-01-31 ENCOUNTER — Ambulatory Visit (HOSPITAL_BASED_OUTPATIENT_CLINIC_OR_DEPARTMENT_OTHER): Payer: BC Managed Care – PPO

## 2021-02-05 ENCOUNTER — Other Ambulatory Visit: Payer: Self-pay

## 2021-02-05 ENCOUNTER — Encounter (HOSPITAL_BASED_OUTPATIENT_CLINIC_OR_DEPARTMENT_OTHER): Payer: Self-pay

## 2021-02-05 ENCOUNTER — Ambulatory Visit (HOSPITAL_BASED_OUTPATIENT_CLINIC_OR_DEPARTMENT_OTHER)
Admission: RE | Admit: 2021-02-05 | Discharge: 2021-02-05 | Disposition: A | Discharge status: Home / Self Care | Payer: BC Managed Care – PPO | Source: Ambulatory Visit | Attending: Family Medicine | Admitting: Family Medicine

## 2021-02-05 DIAGNOSIS — Z1231 Encounter for screening mammogram for malignant neoplasm of breast: Secondary | ICD-10-CM | POA: Insufficient documentation

## 2022-01-19 ENCOUNTER — Other Ambulatory Visit (HOSPITAL_BASED_OUTPATIENT_CLINIC_OR_DEPARTMENT_OTHER): Payer: Self-pay | Admitting: Internal Medicine

## 2022-01-19 DIAGNOSIS — Z1231 Encounter for screening mammogram for malignant neoplasm of breast: Secondary | ICD-10-CM

## 2022-02-09 ENCOUNTER — Encounter (HOSPITAL_BASED_OUTPATIENT_CLINIC_OR_DEPARTMENT_OTHER): Payer: Self-pay

## 2022-02-09 ENCOUNTER — Ambulatory Visit (HOSPITAL_BASED_OUTPATIENT_CLINIC_OR_DEPARTMENT_OTHER)
Admission: RE | Admit: 2022-02-09 | Discharge: 2022-02-09 | Disposition: A | Discharge status: Home / Self Care | Payer: BC Managed Care – PPO | Source: Ambulatory Visit | Attending: Internal Medicine | Admitting: Internal Medicine

## 2022-02-09 DIAGNOSIS — Z1231 Encounter for screening mammogram for malignant neoplasm of breast: Secondary | ICD-10-CM | POA: Diagnosis present

## 2023-01-24 ENCOUNTER — Other Ambulatory Visit (HOSPITAL_BASED_OUTPATIENT_CLINIC_OR_DEPARTMENT_OTHER): Payer: Self-pay | Admitting: Obstetrics and Gynecology

## 2023-01-24 DIAGNOSIS — Z1231 Encounter for screening mammogram for malignant neoplasm of breast: Secondary | ICD-10-CM

## 2023-02-17 ENCOUNTER — Encounter (HOSPITAL_BASED_OUTPATIENT_CLINIC_OR_DEPARTMENT_OTHER): Payer: Self-pay

## 2023-02-17 ENCOUNTER — Ambulatory Visit (HOSPITAL_BASED_OUTPATIENT_CLINIC_OR_DEPARTMENT_OTHER)
Admission: RE | Admit: 2023-02-17 | Discharge: 2023-02-17 | Disposition: A | Discharge status: Home / Self Care | Payer: BC Managed Care – PPO | Source: Ambulatory Visit | Attending: Obstetrics and Gynecology | Admitting: Obstetrics and Gynecology

## 2023-02-17 DIAGNOSIS — Z1231 Encounter for screening mammogram for malignant neoplasm of breast: Secondary | ICD-10-CM | POA: Diagnosis present

## 2023-11-01 IMAGING — MG MM DIGITAL SCREENING BILAT W/ TOMO AND CAD
6 of 10 series · 6 of 30 positions shown · non-contrast
Comparison: Previous exam(s).

CLINICAL DATA: Screening.

EXAM:
DIGITAL SCREENING BILATERAL MAMMOGRAM WITH TOMOSYNTHESIS AND CAD
TECHNIQUE: Bilateral screening digital craniocaudal and mediolateral oblique
mammograms were obtained. Bilateral screening digital breast
tomosynthesis was performed. The images were evaluated with
computer-aided detection.

[R XCCL synth-2D]
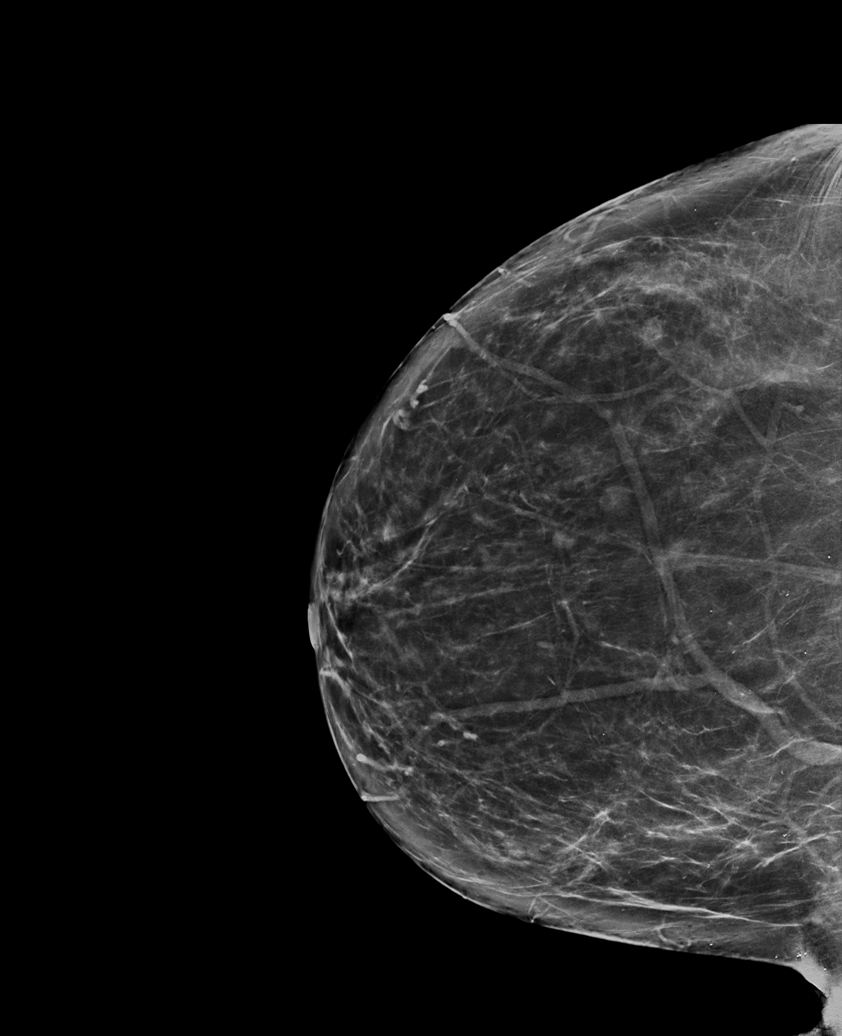

[L CC synth-2D]
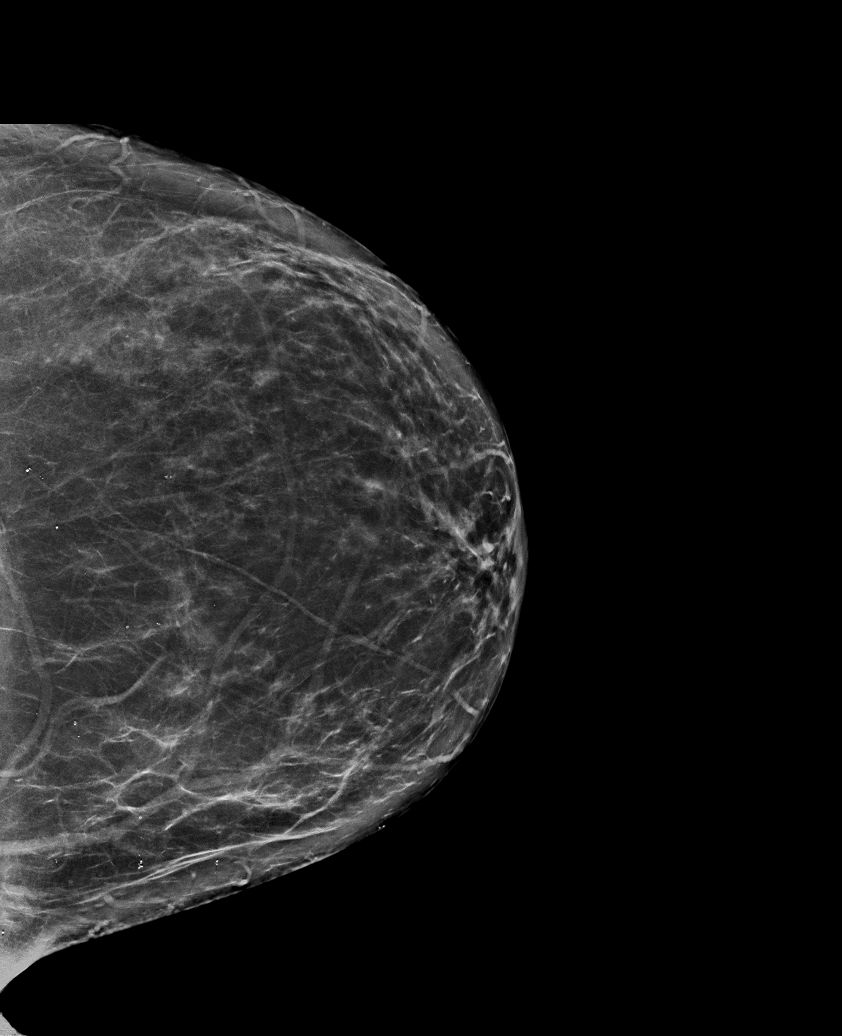

[R MLO synth-2D]
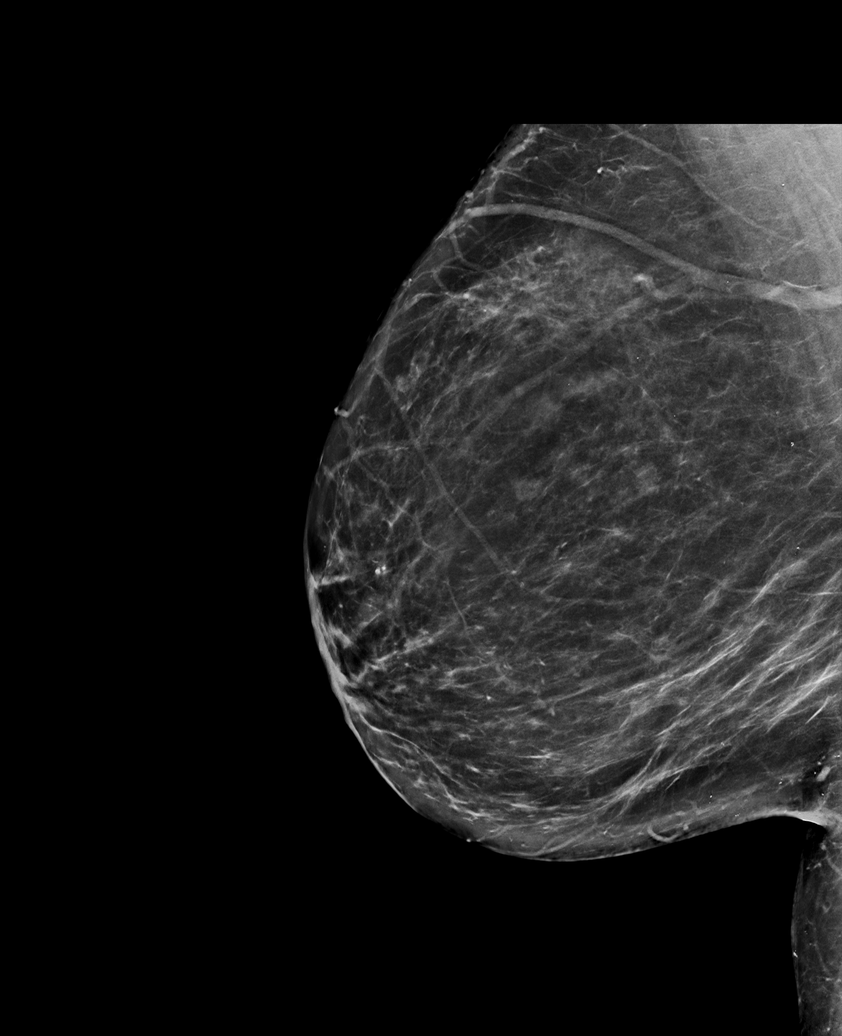

[L MLO synth-2D]
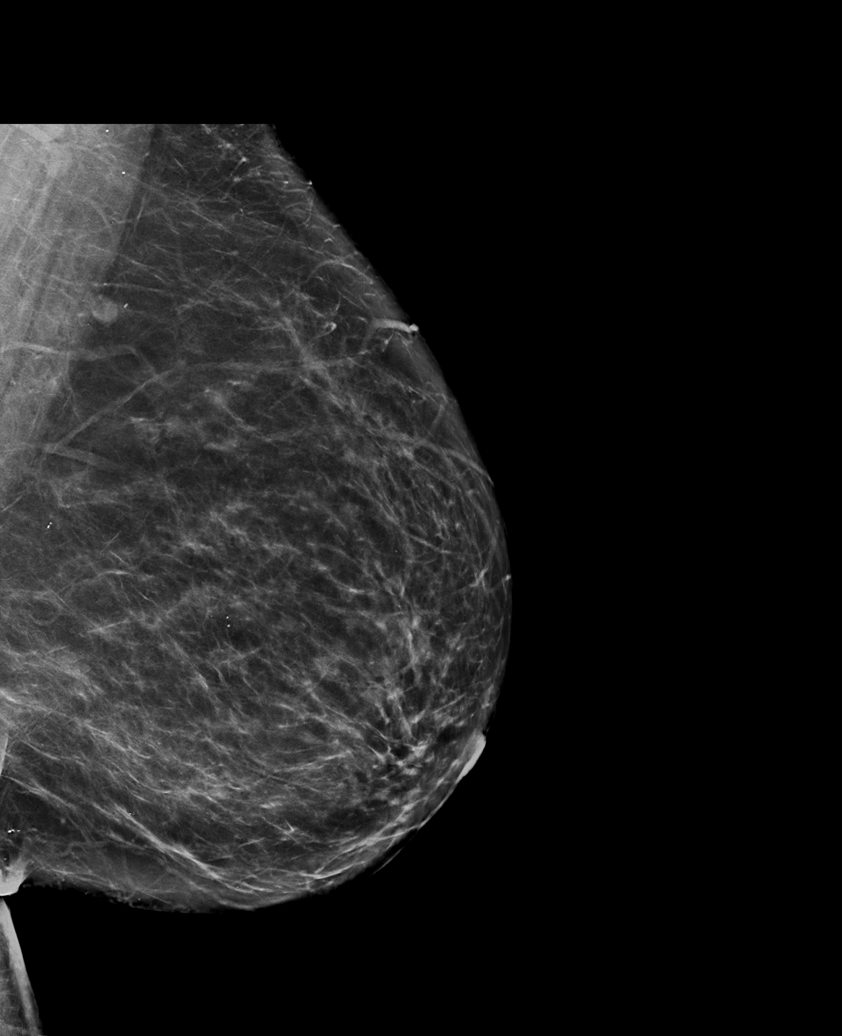

[R CC synth-2D]
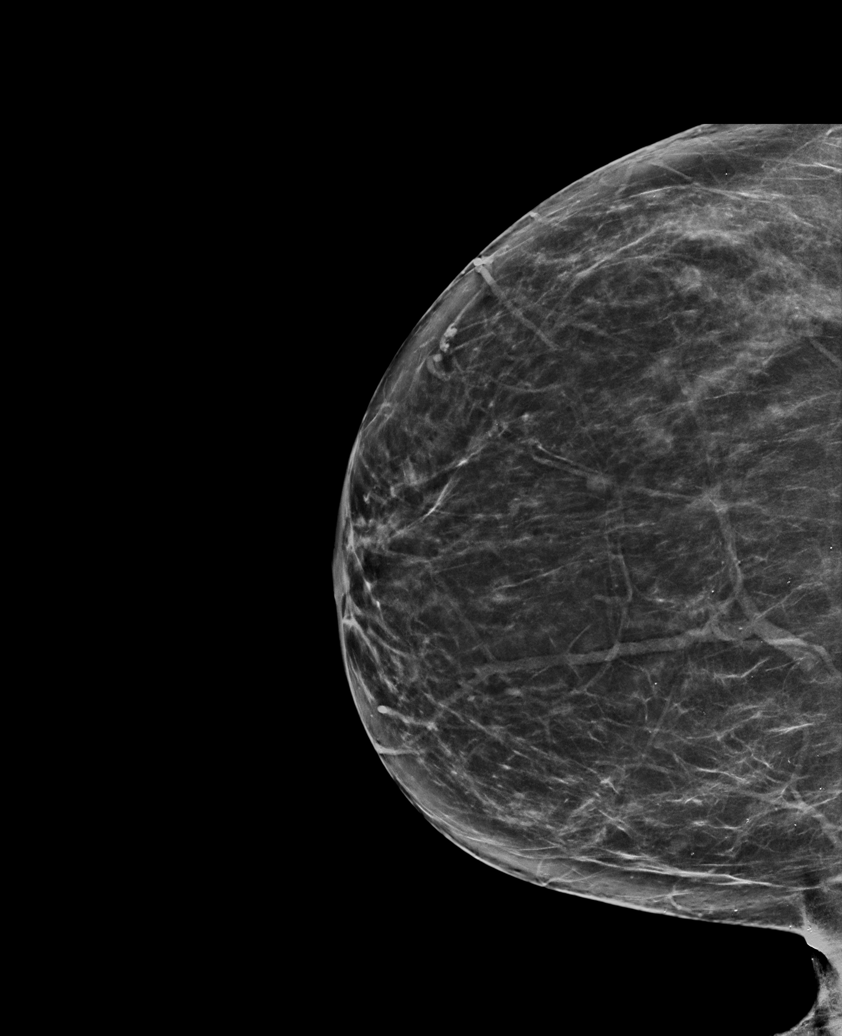

[R CC tomo · tomo slice 37/74.0]
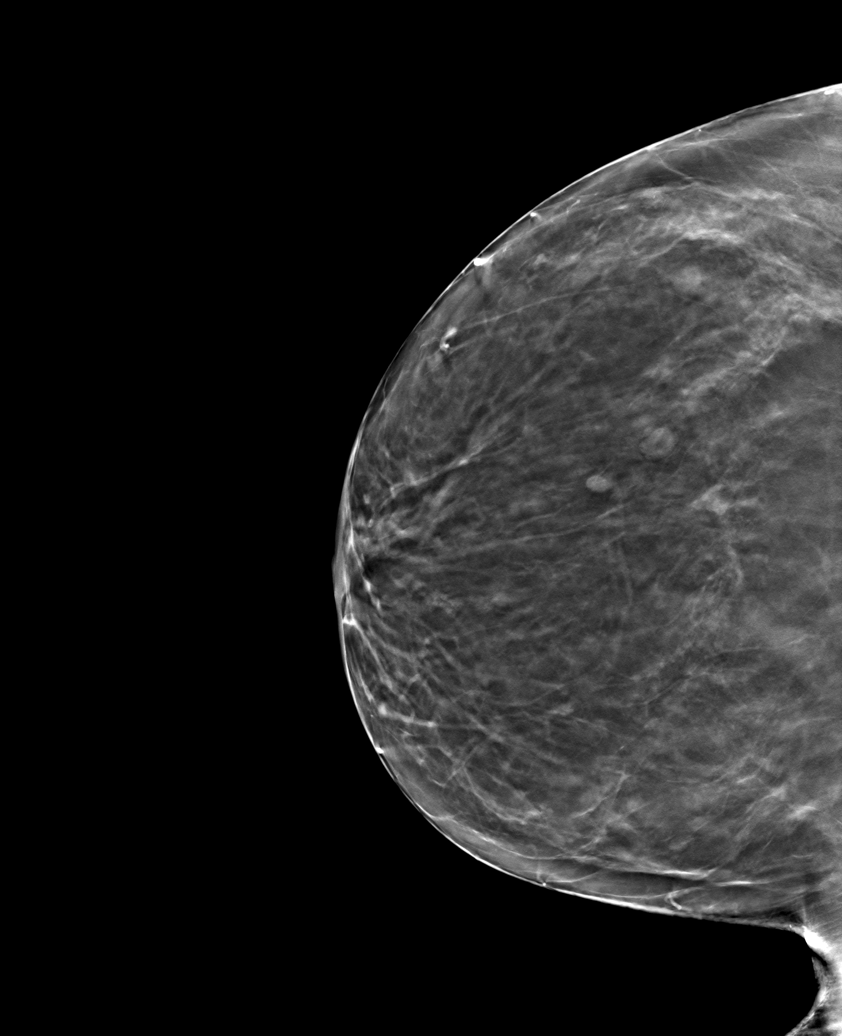

[6 of 30 positions shown; findings below may reference images not displayed]

ACR Breast Density Category b: There are scattered areas of
fibroglandular density.
FINDINGS: There are no findings suspicious for malignancy.
IMPRESSION: No mammographic evidence of malignancy. A result letter of this
screening mammogram will be mailed directly to the patient.

RECOMMENDATION:
Screening mammogram in one year. (Code:51-O-LD2)

BI-RADS CATEGORY  1: Negative.

## 2023-11-30 ENCOUNTER — Other Ambulatory Visit (HOSPITAL_BASED_OUTPATIENT_CLINIC_OR_DEPARTMENT_OTHER): Payer: Self-pay | Admitting: Obstetrics and Gynecology

## 2023-11-30 DIAGNOSIS — Z139 Encounter for screening, unspecified: Secondary | ICD-10-CM

## 2024-02-20 ENCOUNTER — Encounter (HOSPITAL_BASED_OUTPATIENT_CLINIC_OR_DEPARTMENT_OTHER): Payer: Self-pay

## 2024-02-20 ENCOUNTER — Ambulatory Visit (HOSPITAL_BASED_OUTPATIENT_CLINIC_OR_DEPARTMENT_OTHER)
Admission: RE | Admit: 2024-02-20 | Discharge: 2024-02-20 | Disposition: A | Discharge status: Home / Self Care | Payer: BC Managed Care – PPO | Source: Ambulatory Visit | Attending: Obstetrics and Gynecology | Admitting: Obstetrics and Gynecology

## 2024-02-20 DIAGNOSIS — Z139 Encounter for screening, unspecified: Secondary | ICD-10-CM | POA: Diagnosis present

## 2024-02-20 DIAGNOSIS — Z1231 Encounter for screening mammogram for malignant neoplasm of breast: Secondary | ICD-10-CM | POA: Diagnosis not present

## 2024-02-20 DIAGNOSIS — R928 Other abnormal and inconclusive findings on diagnostic imaging of breast: Secondary | ICD-10-CM | POA: Diagnosis not present

## 2024-02-22 ENCOUNTER — Other Ambulatory Visit: Payer: Self-pay | Admitting: Obstetrics and Gynecology

## 2024-02-22 DIAGNOSIS — R928 Other abnormal and inconclusive findings on diagnostic imaging of breast: Secondary | ICD-10-CM

## 2024-03-06 ENCOUNTER — Ambulatory Visit
Admission: RE | Admit: 2024-03-06 | Discharge: 2024-03-06 | Disposition: A | Discharge status: Home / Self Care | Source: Ambulatory Visit | Attending: Obstetrics and Gynecology | Admitting: Obstetrics and Gynecology

## 2024-03-06 DIAGNOSIS — R928 Other abnormal and inconclusive findings on diagnostic imaging of breast: Secondary | ICD-10-CM
